# Patient Record
Sex: Male | Born: 2000 | Race: White | Hispanic: No | Marital: Single | Smoking: Never smoker
Health system: Southern US, Community
[De-identification: ages and names within clinical notes are randomized; demographics above are authoritative.]

## PROBLEM LIST (undated history)

## (undated) DIAGNOSIS — J45909 Unspecified asthma, uncomplicated: Secondary | ICD-10-CM

## (undated) DIAGNOSIS — J309 Allergic rhinitis, unspecified: Secondary | ICD-10-CM

## (undated) HISTORY — DX: Unspecified asthma, uncomplicated: J45.909

## (undated) HISTORY — DX: Allergic rhinitis, unspecified: J30.9

---
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
## 2019-05-02 ENCOUNTER — Ambulatory Visit (INDEPENDENT_AMBULATORY_CARE_PROVIDER_SITE_OTHER): Payer: BC Managed Care – PPO | Admitting: Family Medicine

## 2019-05-02 ENCOUNTER — Other Ambulatory Visit: Payer: Self-pay

## 2019-05-02 ENCOUNTER — Encounter: Payer: Self-pay | Admitting: Family Medicine

## 2019-05-02 DIAGNOSIS — Z20828 Contact with and (suspected) exposure to other viral communicable diseases: Secondary | ICD-10-CM | POA: Diagnosis not present

## 2019-05-02 DIAGNOSIS — J039 Acute tonsillitis, unspecified: Secondary | ICD-10-CM | POA: Diagnosis not present

## 2019-05-02 DIAGNOSIS — Z20822 Contact with and (suspected) exposure to covid-19: Secondary | ICD-10-CM

## 2019-05-02 NOTE — Progress Notes (Signed)
   Subjective:  Audio plus video  Patient ID: Bradley Jordan, male    DOB: 09-19-2000, 18 y.o.   MRN: [id number]  Sore Throat  This is a new problem. The current episode started yesterday. Associated symptoms comments: Aches,chills, sore throat, throat swollen with white blisters. Aches and chills were only yesterday, none today. . Treatments tried: aleve. The treatment provided no relief.   Virtual Visit via Video Note  I connected with Bradley Jordan on 05/02/19 at 11:00 AM EST by a video enabled telemedicine application and verified that I am speaking with the correct person using two identifiers.  Location: Patient: home Provider: office    I discussed the limitations of evaluation and management by telemedicine and the availability of in person appointments. The patient expressed understanding and agreed to proceed.  History of Present Illness:    Observations/Objective:   Assessment and Plan:   Follow Up Instructions:    I discussed the assessment and treatment plan with the patient. The patient was provided an opportunity to ask questions and all were answered. The patient agreed with the plan and demonstrated an understanding of the instructions.   The patient was advised to call back or seek an in-person evaluation if the symptoms worsen or if the condition fails to improve as anticipated.  I provided 17 minutes of non-face-to-face time during this encounter.   Vicente Males, LPN  Patient has noted overall nasal congestion drainage.  Fairly severe sore throat.  Notes white patches.  Also some achiness last couple days somewhat diminished energy.  States girlfriend had strep throat.  Notes slight coughing has already had COVID-19 testing this morning  Review of Systems No vomiting no diarrhea    Objective:   Physical Exam   Virtual     Assessment & Plan:  Impression potential strep throat with known exposure.  However also potential COVID-19.  Definitely needed  testing is done.  Will add antibiotics symptom care discussed warning signs discussed.  COVID-19 considerations discussed

## 2019-07-13 ENCOUNTER — Encounter: Payer: Self-pay | Admitting: Family Medicine

## 2019-07-14 ENCOUNTER — Encounter: Payer: Self-pay | Admitting: Family Medicine
